# Patient Record
Sex: Male | Born: 1980 | Hispanic: No | Marital: Single
Health system: Southern US, Community
[De-identification: ages and names within clinical notes are randomized; demographics above are authoritative.]

---

## 2001-09-25 ENCOUNTER — Emergency Department (HOSPITAL_COMMUNITY): Admission: AC | Admit: 2001-09-25 | Discharge: 2001-09-25 | Payer: Self-pay

## 2017-11-28 ENCOUNTER — Emergency Department (HOSPITAL_COMMUNITY): Payer: BLUE CROSS/BLUE SHIELD

## 2017-11-28 ENCOUNTER — Other Ambulatory Visit: Payer: Self-pay

## 2017-11-28 ENCOUNTER — Encounter (HOSPITAL_COMMUNITY): Payer: Self-pay | Admitting: Emergency Medicine

## 2017-11-28 ENCOUNTER — Emergency Department (HOSPITAL_COMMUNITY)
Admission: EM | Admit: 2017-11-28 | Discharge: 2017-11-28 | Disposition: A | Discharge status: Home / Self Care | Payer: BLUE CROSS/BLUE SHIELD | Attending: Emergency Medicine | Admitting: Emergency Medicine

## 2017-11-28 DIAGNOSIS — Y998 Other external cause status: Secondary | ICD-10-CM | POA: Insufficient documentation

## 2017-11-28 DIAGNOSIS — W501XXA Accidental kick by another person, initial encounter: Secondary | ICD-10-CM | POA: Diagnosis not present

## 2017-11-28 DIAGNOSIS — Y9239 Other specified sports and athletic area as the place of occurrence of the external cause: Secondary | ICD-10-CM | POA: Insufficient documentation

## 2017-11-28 DIAGNOSIS — S299XXA Unspecified injury of thorax, initial encounter: Secondary | ICD-10-CM | POA: Diagnosis present

## 2017-11-28 DIAGNOSIS — Y9359 Activity, other involving other sports and athletics played individually: Secondary | ICD-10-CM | POA: Insufficient documentation

## 2017-11-28 DIAGNOSIS — S301XXA Contusion of abdominal wall, initial encounter: Secondary | ICD-10-CM | POA: Insufficient documentation

## 2017-11-28 DIAGNOSIS — R109 Unspecified abdominal pain: Secondary | ICD-10-CM

## 2017-11-28 MED ORDER — METHOCARBAMOL 500 MG PO TABS: 500.0000 mg | ORAL_TABLET | Freq: Three times a day (TID) | ORAL | 0 refills | Status: AC

## 2017-11-28 MED ORDER — IBUPROFEN 600 MG PO TABS: 600.0000 mg | ORAL_TABLET | Freq: Four times a day (QID) | ORAL | 0 refills | Status: AC | PRN

## 2017-11-28 NOTE — ED Triage Notes (Signed)
Pt complaint of left side/ribcage pain post kneed last week.

## 2017-11-28 NOTE — ED Provider Notes (Signed)
[facility]-EMERGENCY DEPT Provider Note   CSN: [id number] Arrival date & time: 11/28/17  1143     History   Chief Complaint Chief Complaint  Patient presents with  . Side Pain    HPI Steven Fry is a 37 y.o. male.  Patient is a 37 year old male who presents to the emergency department with a complaint of left-sided rib area pain.  The patient states that approximately the 6 or 7 days ago [facility]  He received a knee to the left upper quadrant of the abdomen and rib area.  He states that it knocked the wind out of him.  He did not lose consciousness.  Since that time he has pain with taking a deep breath, with turning, twisting, or laying on that side.  He has tried over-the-counter medications without any improvement.  The patient denies any fever or chills, no cough, no blood in the urine, no blood in his stool.  Is been no vomiting reported.  Patient denies being on any anticoagulation medications, and has no history of bleeding disorders.  No previous operations or procedures involving the left upper quadrant area.     History reviewed. No pertinent past medical history.  There are no active problems to display for this patient.   History reviewed. No pertinent surgical history.      Home Medications    Prior to Admission medications   Not on File    Family History No family history on file.  Social History Social History   Tobacco Use  . Smoking status: Not on file  Substance Use Topics  . Alcohol use: Not on file  . Drug use: Not on file     Allergies   Patient has no known allergies.   Review of Systems Review of Systems  Constitutional: Negative for activity change.       All ROS Neg except as noted in HPI  HENT: Negative for nosebleeds.   Eyes: Negative for photophobia and discharge.  Respiratory: Negative for cough, shortness of breath and wheezing.   Cardiovascular: Negative for chest pain and  palpitations.  Gastrointestinal: Positive for abdominal pain. Negative for blood in stool.  Genitourinary: Negative for dysuria, frequency and hematuria.  Musculoskeletal: Negative for arthralgias, back pain and neck pain.  Skin: Negative.   Neurological: Negative for dizziness, seizures and speech difficulty.  Psychiatric/Behavioral: Negative for confusion and hallucinations.     Physical Exam Updated Vital Signs BP 111/75 (BP Location: Left Arm)   Pulse 68   Temp 98 F (36.7 C) (Oral)   SpO2 100%   Physical Exam  Constitutional: He is oriented to person, place, and time. He appears well-developed and well-nourished.  Non-toxic appearance.  HENT:  Head: Normocephalic.  Right Ear: Tympanic membrane and external ear normal.  Left Ear: Tympanic membrane and external ear normal.  Eyes: Pupils are equal, round, and reactive to light. EOM and lids are normal.  Neck: Normal range of motion. Neck supple. Carotid bruit is not present.  Cardiovascular: Normal rate, regular rhythm, normal heart sounds, intact distal pulses and normal pulses.  Pulmonary/Chest: Breath sounds normal. No respiratory distress.  Abdominal: Soft. Bowel sounds are normal. There is tenderness in the left upper quadrant. There is guarding. There is no rigidity and no CVA tenderness.    Musculoskeletal: Normal range of motion.  Lymphadenopathy:       Head (right side): No submandibular adenopathy present.       Head (left  side): No submandibular adenopathy present.    He has no cervical adenopathy.  Neurological: He is alert and oriented to person, place, and time. He has normal strength. No cranial nerve deficit or sensory deficit.  Skin: Skin is warm and dry.  Psychiatric: He has a normal mood and affect. His speech is normal.  Nursing note and vitals reviewed.    ED Treatments / Results  Labs (all labs ordered are listed, but only abnormal results are displayed) Labs Reviewed - No data to  display  EKG None  Radiology No results found.  Procedures Procedures (including critical care time)  Medications Ordered in ED Medications - No data to display   Initial Impression / Assessment and Plan / ED Course  I have reviewed the triage vital signs and the nursing notes.  Pertinent labs & imaging results that were available during my care of the patient were reviewed by me and considered in my medical decision making (see chart for details).       Final Clinical Impressions(s) / ED Diagnoses MDM  Vital signs are within normal limits.  Pulse oximetry is 100% on room air.  Patient is ambulatory, but with pain.   We will obtain ultrasound to evaluate bruise to the spleen, or other evidence of left upper quadrant trauma.  Ultrasound of the left upper quadrant including the spleen is negative.  Recheck.  Patient in no distress.  He is texting on his cell phone and conversing with a friend.  He states the pain is still present in his left upper quadrant. Xray of the left ribs and chest is negative. Pt in no distress. Speaks in complete sentences.  Rx for robaxin and ibuprofen given to the patient. Discussed findings with pt in terms he understands. He will return if any changes or problems in condition.   Final diagnoses:  Left sided abdominal pain  Contusion of abdominal wall, initial encounter    ED Discharge Orders        Ordered    methocarbamol (ROBAXIN) 500 MG tablet  3 times daily     11/28/17 1413    ibuprofen (ADVIL,MOTRIN) 600 MG tablet  Every 6 hours PRN     11/28/17 1413       Ivery Quale, PA-C 11/28/17 1422    Arby Barrette, MD 11/28/17 1639

## 2017-11-28 NOTE — Discharge Instructions (Addendum)
Your vital signs within normal limits.  Your oxygen level is 100% on room air.  The ultrasound of the upper quadrant of your abdomen is negative for hematoma, bleed, or abscess.  The x-ray of your ribs and lung are also negative.  I suspect that you have a deep bruise/contusion to the left upper abdomen and chest wall area.  Please use Robaxin for spasm type pain, and ibuprofen with breakfast, lunch, dinner, and at bedtime.  Robaxin may cause drowsiness.  Please do not drive a vehicle, operate machinery, drink alcohol, and illegal documents, or participate in activities requiring concentration when taking Robaxin.  Please see your primary physician or return to the emergency department if any changes in your condition, problems, or concerns.

## 2020-01-14 IMAGING — CR DG RIBS W/ CHEST 3+V*L*
3 series · 3 of 3 positions shown · non-contrast
Comparison: None.

CLINICAL DATA: Head and left upper quadrant during [employer] training on
[REDACTED]. Severe pain ever since.

EXAM:
LEFT RIBS AND CHEST - 3+ VIEW

[w chest pa]
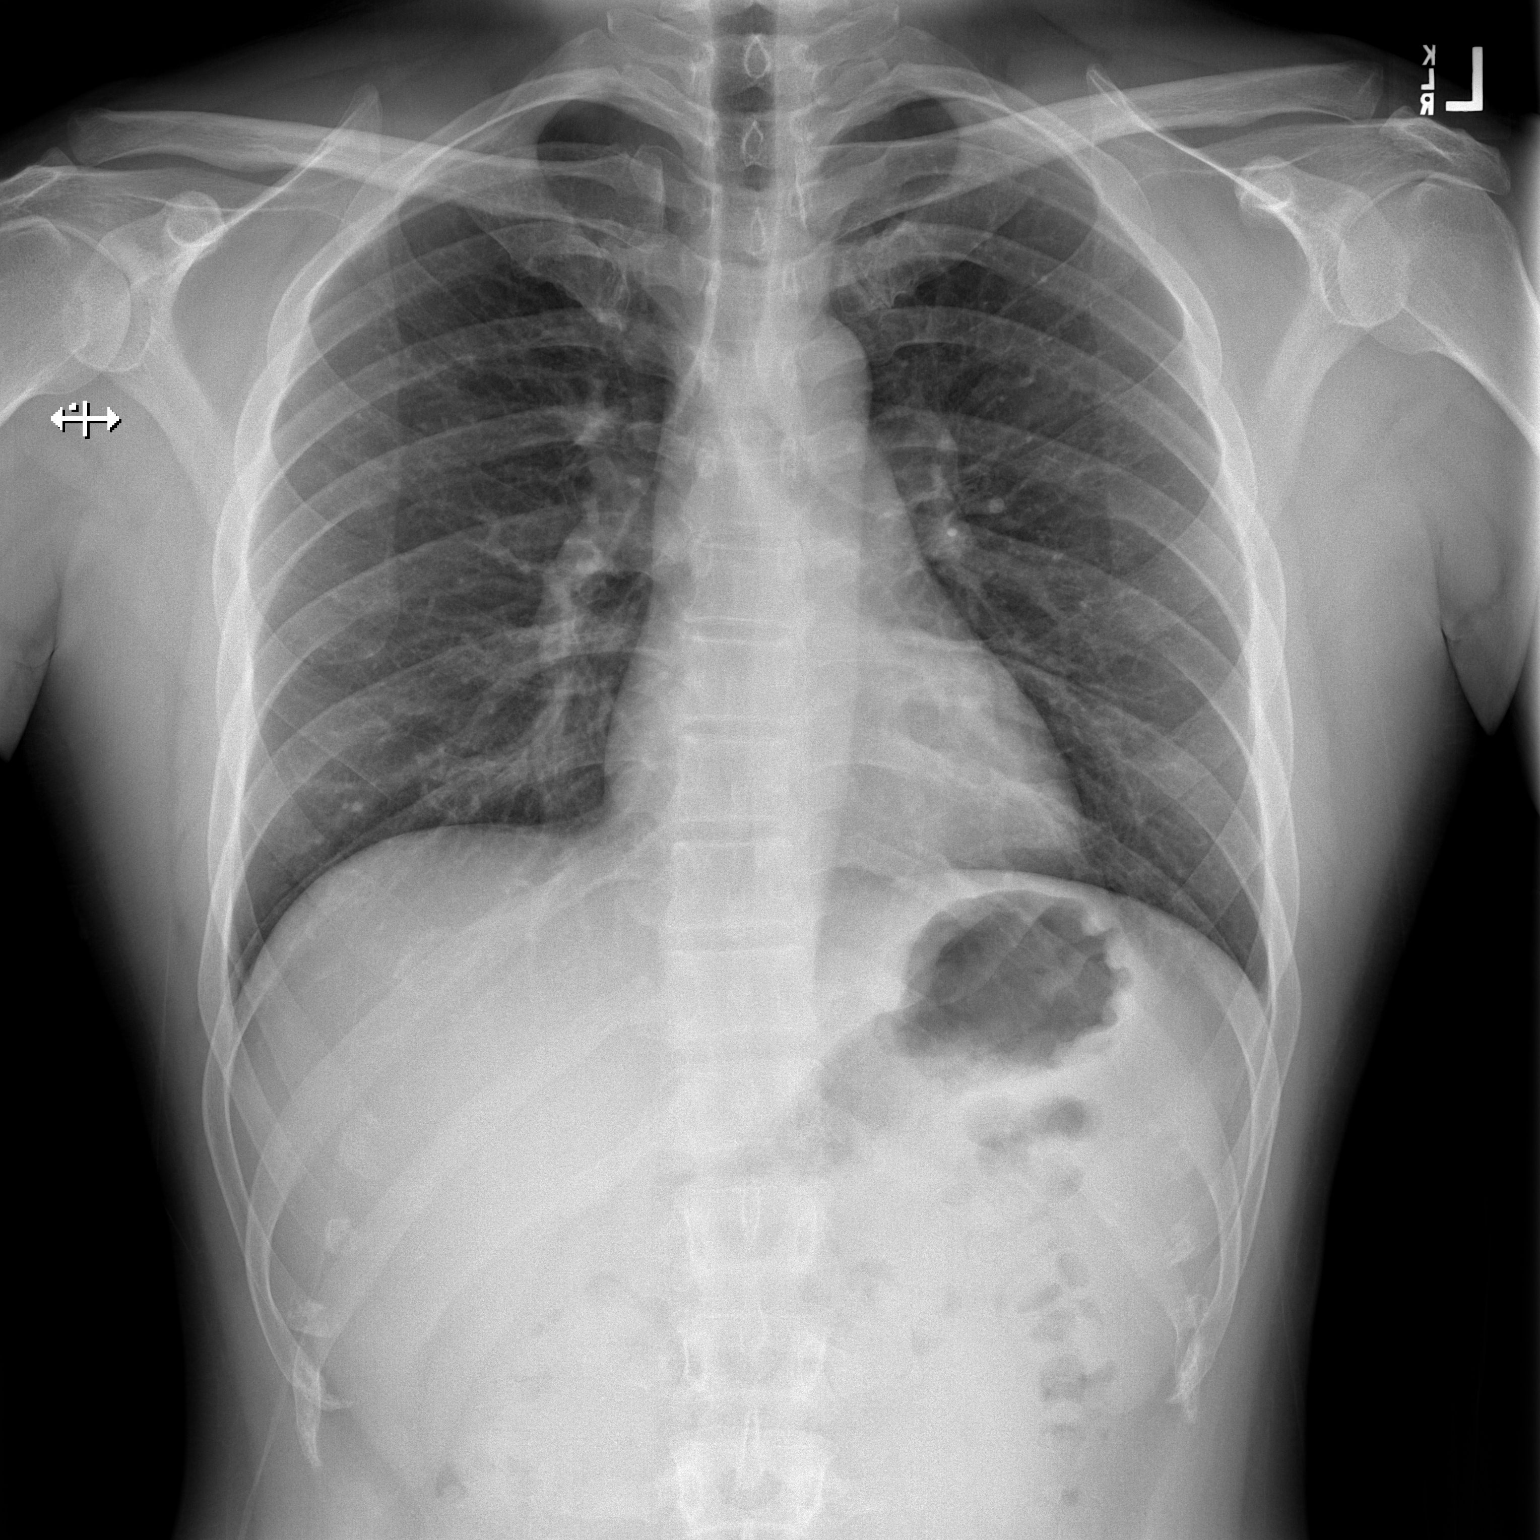

[w ribs ap upper left]
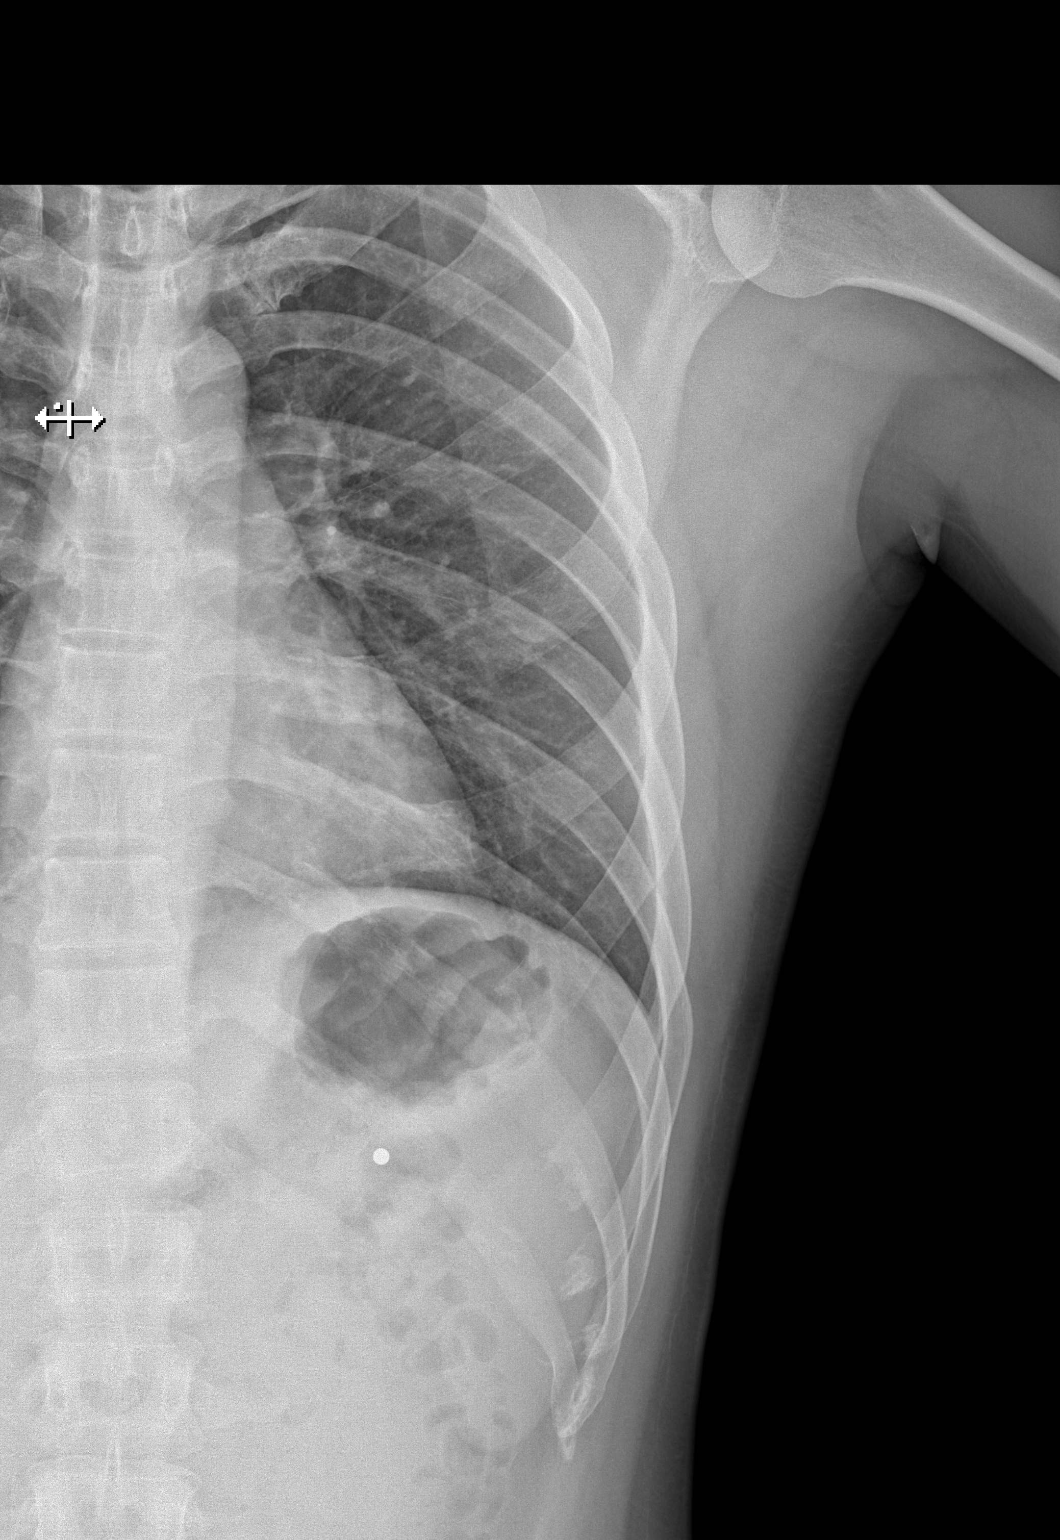

[w ribs obl left]
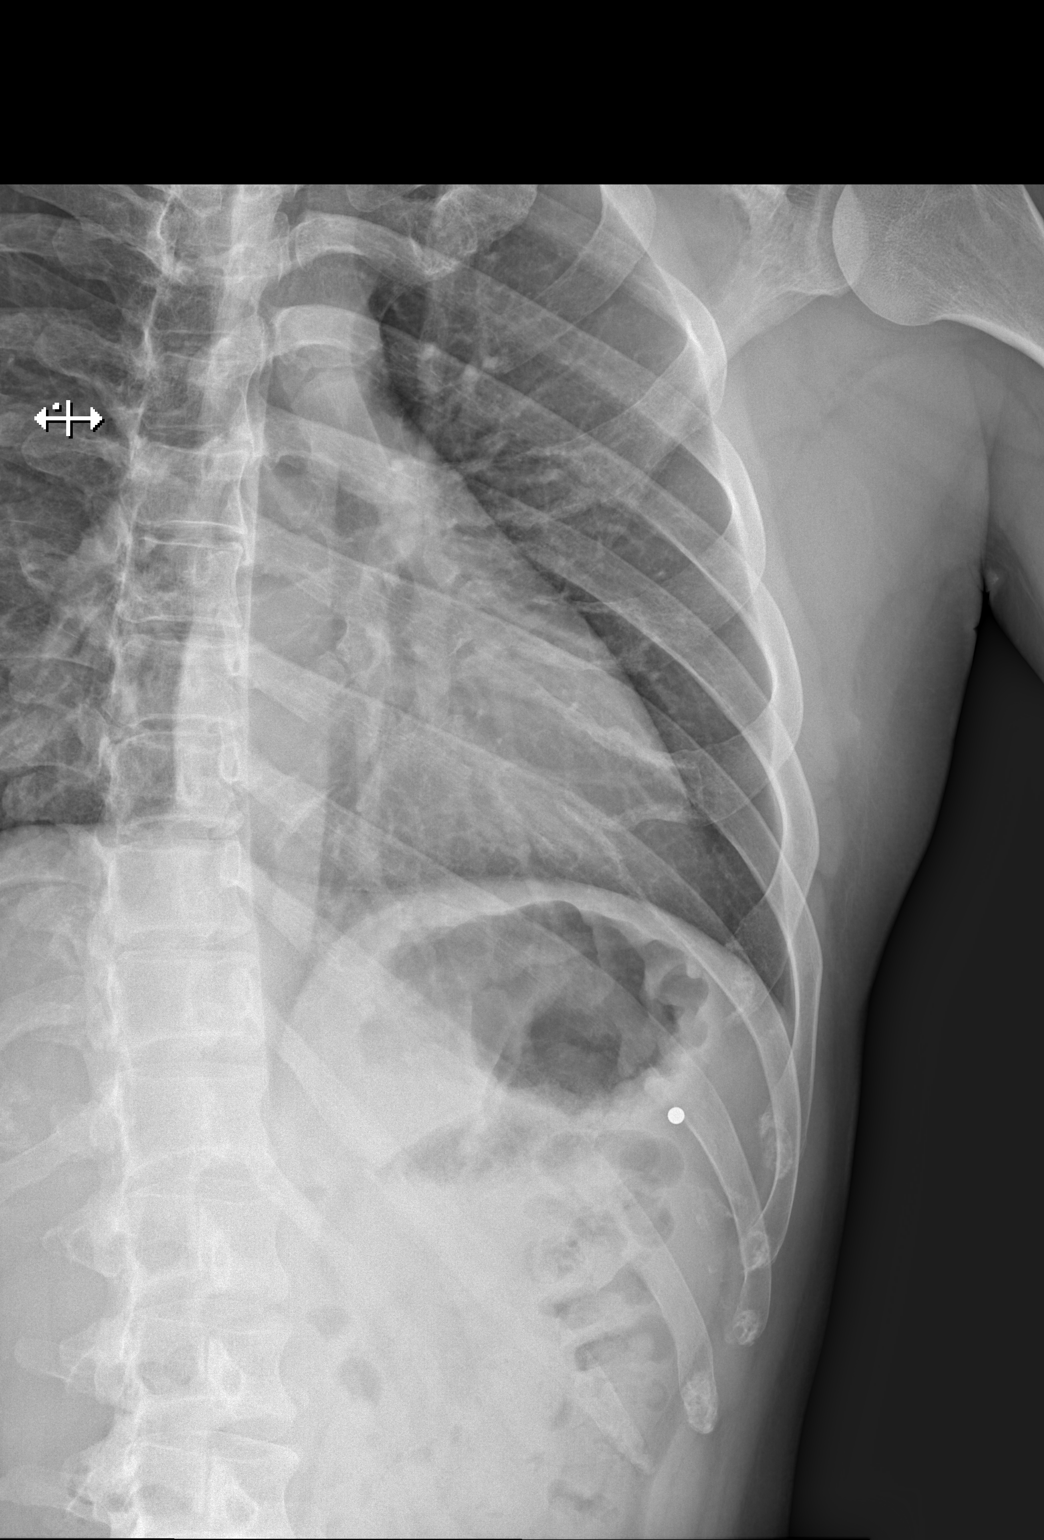

[3 of 3 positions shown; findings below may reference images not displayed]

FINDINGS: No convincing rib fracture. Apparent mild irregularity of posterior
left ninth and tenth ribs is not seen on the oblique view and is
more likely from overlapping chondral calcification. Negative for
hemothorax or pneumothorax. Normal heart size.
IMPRESSION: Negative.
